# Patient Record
Sex: Male | Born: 1975 | Hispanic: No | Marital: Married | Smoking: Current some day smoker
Health system: Southern US, Community
[De-identification: ages and names within clinical notes are randomized; demographics above are authoritative.]

## PROBLEM LIST (undated history)

## (undated) DIAGNOSIS — K2970 Gastritis, unspecified, without bleeding: Secondary | ICD-10-CM

## (undated) DIAGNOSIS — K219 Gastro-esophageal reflux disease without esophagitis: Secondary | ICD-10-CM

## (undated) DIAGNOSIS — K2990 Gastroduodenitis, unspecified, without bleeding: Secondary | ICD-10-CM

## (undated) DIAGNOSIS — E7800 Pure hypercholesterolemia, unspecified: Secondary | ICD-10-CM

## (undated) HISTORY — DX: Gastro-esophageal reflux disease without esophagitis: K21.9

## (undated) HISTORY — DX: Gastroduodenitis, unspecified, without bleeding: K29.90

## (undated) HISTORY — DX: Gastritis, unspecified, without bleeding: K29.70

---

## 1999-12-01 ENCOUNTER — Emergency Department (HOSPITAL_COMMUNITY): Admission: EM | Admit: 1999-12-01 | Discharge: 1999-12-01 | Payer: Self-pay | Admitting: *Deleted

## 2004-02-16 ENCOUNTER — Emergency Department (HOSPITAL_COMMUNITY): Admission: EM | Admit: 2004-02-16 | Discharge: 2004-02-17 | Payer: Self-pay | Admitting: Emergency Medicine

## 2004-03-02 ENCOUNTER — Ambulatory Visit (HOSPITAL_COMMUNITY): Admission: RE | Admit: 2004-03-02 | Discharge: 2004-03-02 | Payer: Self-pay | Admitting: Gastroenterology

## 2004-05-26 ENCOUNTER — Ambulatory Visit (HOSPITAL_COMMUNITY): Admission: RE | Admit: 2004-05-26 | Discharge: 2004-05-26 | Payer: Self-pay | Admitting: Gastroenterology

## 2004-12-15 ENCOUNTER — Encounter: Admission: RE | Admit: 2004-12-15 | Discharge: 2004-12-15 | Payer: Self-pay | Admitting: Orthopedic Surgery

## 2006-01-09 ENCOUNTER — Ambulatory Visit: Payer: Self-pay | Admitting: Gastroenterology

## 2006-01-11 ENCOUNTER — Ambulatory Visit: Payer: Self-pay | Admitting: Gastroenterology

## 2006-02-28 ENCOUNTER — Emergency Department (HOSPITAL_COMMUNITY): Admission: EM | Admit: 2006-02-28 | Discharge: 2006-02-28 | Payer: Self-pay | Admitting: Emergency Medicine

## 2006-03-27 ENCOUNTER — Encounter: Admission: RE | Admit: 2006-03-27 | Discharge: 2006-05-12 | Payer: Self-pay | Admitting: Family Medicine

## 2007-10-16 ENCOUNTER — Ambulatory Visit: Payer: Self-pay | Admitting: Gastroenterology

## 2008-02-13 DIAGNOSIS — K219 Gastro-esophageal reflux disease without esophagitis: Secondary | ICD-10-CM

## 2008-02-13 DIAGNOSIS — K2990 Gastroduodenitis, unspecified, without bleeding: Secondary | ICD-10-CM

## 2008-02-13 DIAGNOSIS — K2970 Gastritis, unspecified, without bleeding: Secondary | ICD-10-CM | POA: Insufficient documentation

## 2010-02-09 ENCOUNTER — Telehealth: Payer: Self-pay | Admitting: Gastroenterology

## 2010-03-16 ENCOUNTER — Encounter (INDEPENDENT_AMBULATORY_CARE_PROVIDER_SITE_OTHER): Payer: Self-pay | Admitting: *Deleted

## 2010-03-16 ENCOUNTER — Ambulatory Visit: Payer: Self-pay | Admitting: Gastroenterology

## 2010-03-16 DIAGNOSIS — K625 Hemorrhage of anus and rectum: Secondary | ICD-10-CM | POA: Insufficient documentation

## 2010-03-17 LAB — CONVERTED CEMR LAB
BUN: 10 mg/dL (ref 6–23)
Basophils Absolute: 0 10*3/uL (ref 0.0–0.1)
Basophils Relative: 1 % (ref 0.0–3.0)
CO2: 33 meq/L — ABNORMAL HIGH (ref 19–32)
Calcium: 9.6 mg/dL (ref 8.4–10.5)
Chloride: 102 meq/L (ref 96–112)
Creatinine, Ser: 0.8 mg/dL (ref 0.4–1.5)
Eosinophils Absolute: 0.2 10*3/uL (ref 0.0–0.7)
Eosinophils Relative: 7.1 % — ABNORMAL HIGH (ref 0.0–5.0)
GFR calc non Af Amer: 117.61 mL/min (ref >60)
Glucose, Bld: 95 mg/dL (ref 70–99)
HCT: 43.6 % (ref 39.0–52.0)
Hemoglobin: 15 g/dL (ref 13.0–17.0)
Lymphocytes Relative: 41.2 % (ref 12.0–46.0)
Lymphs Abs: 1.5 10*3/uL (ref 0.7–4.0)
MCHC: 34.5 g/dL (ref 30.0–36.0)
MCV: 98.5 fL (ref 78.0–100.0)
Monocytes Absolute: 0.4 10*3/uL (ref 0.1–1.0)
Monocytes Relative: 11.4 % (ref 3.0–12.0)
Neutro Abs: 1.4 10*3/uL (ref 1.4–7.7)
Neutrophils Relative %: 39.3 % — ABNORMAL LOW (ref 43.0–77.0)
Platelets: 182 10*3/uL (ref 150.0–400.0)
Potassium: 4.7 meq/L (ref 3.5–5.1)
RBC: 4.43 M/uL (ref 4.22–5.81)
RDW: 12.3 % (ref 11.5–14.6)
Sodium: 142 meq/L (ref 135–145)
TSH: 2.04 microintl units/mL (ref 0.35–5.50)
WBC: 3.5 10*3/uL — ABNORMAL LOW (ref 4.5–10.5)

## 2010-03-23 ENCOUNTER — Ambulatory Visit: Payer: Self-pay | Admitting: Gastroenterology

## 2010-06-09 ENCOUNTER — Telehealth: Payer: Self-pay | Admitting: Gastroenterology

## 2011-01-09 ENCOUNTER — Encounter: Payer: Self-pay | Admitting: Gastroenterology

## 2011-01-20 NOTE — Procedures (Signed)
Summary: Colonoscopy  Patient: Corday Wyka Note: All result statuses are Final unless otherwise noted.  Tests: (1) Colonoscopy (COL)   COL Colonoscopy           DONE     [facility]     [address]     [place], [place]  [address]           COLONOSCOPY PROCEDURE REPORT           PATIENT:  Dustin Meyer, Dustin Meyer  MR#:  [id number]     BIRTHDATE:  1976/01/04, 34 yrs. old  GENDER:  male     ENDOSCOPIST:  Rachael Fee, MD     REF. BY:     PROCEDURE DATE:  03/23/2010     PROCEDURE:  Diagnostic Colonoscopy     ASA CLASS:  Class II     INDICATIONS:  intermittent rectal bleeding     MEDICATIONS:   Fentanyl 100 mcg IV, Versed 10 mg IV           DESCRIPTION OF PROCEDURE:   After the risks benefits and     alternatives of the procedure were thoroughly explained, informed     consent was obtained.  Digital rectal exam was performed and     revealed no rectal masses.   The LB CF-[id number] endoscope     was introduced through the anus and advanced to the cecum, which     was identified by both the appendix and ileocecal valve, without     limitations.  The quality of the prep was good, using MoviPrep.     The instrument was then slowly withdrawn as the colon was fully     examined.     <<PROCEDUREIMAGES>>           FINDINGS:  A normal appearing cecum, ileocecal valve, and     appendiceal orifice were identified. The ascending, hepatic     flexure, transverse, splenic flexure, descending, sigmoid colon,     and rectum appeared unremarkable (see image1, image2, and image3).     Retroflexed views in the rectum revealed no abnormalities.    The     scope was then withdrawn from the patient and the procedure     completed.           COMPLICATIONS:  None     ENDOSCOPIC IMPRESSION:     1) Normal colon ; no inflammation, no polyps or cancers           Intermittent rectal bleeding is probably from small fissure or     hemorrhoids that can be intermittent.     Start miralax (to help  loosen stools a bit, keep you from     straining), one scoop every day.     Use preparation H as needed for minor rectal bleeding.           ______________________________     Rachael Fee, MD           n.     eSIGNED:   Rachael Fee at 03/23/2010 12:12 PM           Siri Cole, [phone]  Note: An exclamation mark (!) indicates a result that was not dispersed into the flowsheet. Document Creation Date: 03/23/2010 12:12 PM _______________________________________________________________________  (1) Order result status: Final Collection or observation date-time: 03/23/2010 12:07 Requested date-time:  Receipt date-time:  Reported date-time:  Referring Physician:   Ordering Physician: Rob Bunting [phone]) Specimen Source:  Source:  [employer] Order Number: [id number] Lab site:

## 2011-01-20 NOTE — Progress Notes (Signed)
Summary: Nexium refill ?'s   Phone Note Call from Patient Call back at Home Phone [phone]   Caller: wife, Dustin Meyer Call For: Dr. Christella Hartigan Reason for Call: Talk to Nurse Summary of Call: would like rx for Nexium changed to a 90-day count so that it is more affordable for pt... [facility] [address] in [place], but pt's ins company would like the revised rx sent to them and not [facility]...  Initial call taken by: Vallarie Mare,  June 09, 2010 4:23 PM  Follow-up for Phone Call        left message on machine to call back [name] CMA Duncan Dull)  June 10, 2010 8:05 AM   pt returned call and gave me a number to fax rx info [phone]. [name] CMA Duncan Dull)  June 10, 2010 8:20 AM  Follow-up by: [name] CMA Duncan Dull),  June 10, 2010 8:21 AM    Prescriptions: NEXIUM 40 MG  CPDR (ESOMEPRAZOLE MAGNESIUM) 1 capsule each day 30 minutes before breakfast meal  #90 x 3   Entered by:   [name] CMA (AAMA)   Authorized by:   Rachael Fee MD   Signed by:   [name] CMA (AAMA) on 06/10/2010   Method used:   Printed then faxed to ...       [facility] [address] [address] # [phone]* (retail)       [address] [place], [place]  [address]       Ph: [phone]       Fax: [phone]   RxID:   [id number]

## 2011-01-20 NOTE — Letter (Signed)
Summary: [facility] Instructions  [facility]  [address] [place], [place] [address]   Phone: [phone]  Fax: [phone]       Dustin Meyer    [birth year]    MRN: [id number]        Procedure Day /Date:03/23/10     Arrival Time:1030 am     Procedure Time:1130 am     Location of Procedure:                    X  [facility] (4th Floor)   PREPARATION FOR COLONOSCOPY WITH MOVIPREP   Starting 5 days prior to your procedure 03/18/10 do not eat nuts, seeds, popcorn, corn, beans, peas,  salads, or any raw vegetables.  Do not take any fiber supplements (e.g. Metamucil, Citrucel, and Benefiber).  THE DAY BEFORE YOUR PROCEDURE         DATE: 03/22/10  DAY: MON  1.  Drink clear liquids the entire day-NO SOLID FOOD  2.  Do not drink anything colored red or purple.  Avoid juices with pulp.  No orange juice.  3.  Drink at least 64 oz. (8 glasses) of fluid/clear liquids during the day to prevent dehydration and help the prep work efficiently.  CLEAR LIQUIDS INCLUDE: Water Jello Ice Popsicles Tea (sugar ok, no milk/cream) Powdered fruit flavored drinks Coffee (sugar ok, no milk/cream) Gatorade Juice: apple, white grape, white cranberry  Lemonade Clear bullion, consomm, broth Carbonated beverages (any kind) Strained chicken noodle soup Hard Candy                             4.  In the morning, mix first dose of MoviPrep solution:    Empty 1 Pouch A and 1 Pouch B into the disposable container    Add lukewarm drinking water to the top line of the container. Mix to dissolve    Refrigerate (mixed solution should be used within 24 hrs)  5.  Begin drinking the prep at 5:00 p.m. The MoviPrep container is divided by 4 marks.   Every 15 minutes drink the solution down to the next mark (approximately 8 oz) until the full liter is complete.   6.  Follow completed prep with 16 oz of clear liquid of your choice (Nothing red or purple).  Continue to drink clear  liquids until bedtime.  7.  Before going to bed, mix second dose of MoviPrep solution:    Empty 1 Pouch A and 1 Pouch B into the disposable container    Add lukewarm drinking water to the top line of the container. Mix to dissolve    Refrigerate  THE DAY OF YOUR PROCEDURE      DATE: 03/23/10 DAY: TUE  Beginning at 630 a.m. (5 hours before procedure):         1. Every 15 minutes, drink the solution down to the next mark (approx 8 oz) until the full liter is complete.  2. Follow completed prep with 16 oz. of clear liquid of your choice.    3. You may drink clear liquids until 930 am (2 HOURS BEFORE PROCEDURE).   MEDICATION INSTRUCTIONS  Unless otherwise instructed, you should take regular prescription medications with a small sip of water   as early as possible the morning of your procedure.         OTHER INSTRUCTIONS  You will need a responsible adult at least 35 years of age to  accompany you and drive you home.   This person must remain in the waiting room during your procedure.  Wear loose fitting clothing that is easily removed.  Leave jewelry and other valuables at home.  However, you may wish to bring a book to read or  an iPod/MP3 player to listen to music as you wait for your procedure to start.  Remove all body piercing jewelry and leave at home.  Total time from sign-in until discharge is approximately 2-3 hours.  You should go home directly after your procedure and rest.  You can resume normal activities the  day after your procedure.  The day of your procedure you should not:   Drive   Make legal decisions   Operate machinery   Drink alcohol   Return to work  You will receive specific instructions about eating, activities and medications before you leave.    The above instructions have been reviewed and explained to me by   _______________________    I fully understand and can verbalize these instructions _____________________________ Date  _________

## 2011-01-20 NOTE — Assessment & Plan Note (Signed)
Review of gastrointestinal problems: 1. Chronic gastroesophageal reflux disease.  Pyrosis.  Acid regurgitation, improved with proton pump inhibitor once daily. EGD in 2007 showed no esophagitis, no Barrett's no strictures.   History of Present Illness Visit Type: Follow-up Visit Primary GI MD: Rob Bunting MD Primary Provider: n/a Chief Complaint: BRB with stools/med refills History of Present Illness:     very pleasant 35 year old man whom I last saw about 2 years ago.  he takes Nexium every morning and it works very well.  Overall no alarm symptoms.    He has noted red rectal bleeding every 2-3 months. this started about one year ago. Some perianal bleeding.  He has to push, strain to have a BM usually, every day.  Uses a lot of TP.  he has tried fiber supplements without much change in his bowel habits.           Current Medications (verified): 1)  Nexium 40 Mg  Cpdr (Esomeprazole Magnesium) .Marland Kitchen.. 1 Capsule Each Day 30 Minutes Before Meal 2)  Ibuprofen 200 Mg Tabs (Ibuprofen) .... As Needed  Allergies (verified): No Known Drug Allergies  Family History: no colon cancer  Vital Signs:  Patient profile:   35 year old male Height:      60 inches Weight:      138.13 pounds BMI:     27.07 Pulse rate:   84 / minute Pulse rhythm:   regular BP sitting:   108 / 80  (left arm) Cuff size:   regular  Vitals Entered By: June McMurray CMA Duncan Dull) (March 16, 2010 10:03 AM)  Physical Exam  Additional Exam:  Constitutional: generally well appearing Psychiatric: alert and oriented times 3 Abdomen: soft, non-tender, non-distended, normal bowel sounds Anorectal examination: no obvious external anal hemorrhoids, no anal fissures, no masses in rectum, no stool on finger   Impression & Recommendations:  Problem # 1:  GERD (ICD-530.81) his symptoms are under very good control as long as he takes proton pump inhibitor once daily. He has no alarm symptoms. I will refill his Nexium he  will continue taking it once daily.  Problem # 2:  mild intermittent bright red blood per rectum he does not appear to be anemic clinically however we will get a CBC to be certain. His stools are also softer than usual and we will arrange for a complete metabolic profile as well as thyroid testing. This sounds like minor, hemorrhoidal or anal fissure related bleeding. None were seen on today's examination however. To be safe, we will arrange for full colonoscopy to exclude more significant causes such as neoplasm.  Other Orders: TLB-CBC Platelet - w/Differential (85025-CBCD) TLB-BMP (Basic Metabolic Panel-BMET) (80048-METABOL) TLB-TSH (Thyroid Stimulating Hormone) (84443-TSH)  Patient Instructions: 1)  You will be scheduled to have a colonoscopy. 2)  Will refill your nexium, one pill once dialy. 3)  You will get lab test(s) done today (cbc, tsh, bmet). 4)  The medication list was reviewed and reconciled.  All changed / newly prescribed medications were explained.  A complete medication list was provided to the patient / caregiver. Prescriptions: NEXIUM 40 MG  CPDR (ESOMEPRAZOLE MAGNESIUM) 1 capsule each day 30 minutes before breakfast meal  #30 x 11   Entered and Authorized by:   Rachael Fee MD   Signed by:   Rachael Fee MD on 03/16/2010   Method used:   Electronically to        [facility] [address] # [phone]* (retail)  [address] [place], [place]  [address]       Ph: [phone]       Fax: [phone]   RxID:   [id number]   Appended Document: Orders Update/movi    Clinical Lists Changes  Problems: Added new problem of RECTAL BLEEDING (ICD-569.3) Medications: Added new medication of MOVIPREP 100 GM  SOLR (PEG-KCL-NACL-NASULF-NA ASC-C) As per prep instructions. - Signed Rx of MOVIPREP 100 GM  SOLR (PEG-KCL-NACL-NASULF-NA ASC-C) As per prep instructions.;  #1 x 0;  Signed;  Entered by: [name] CMA (AAMA);  Authorized by: Rachael Fee MD;  Method used: Electronically to [facility] # [phone]*, [address] [place], [place], [place]  [address], Ph: [phone], Fax: [phone] Orders: Added new Test order of Colonoscopy (Colon) - Signed    Prescriptions: MOVIPREP 100 GM  SOLR (PEG-KCL-NACL-NASULF-NA ASC-C) As per prep instructions.  #1 x 0   Entered by:   [name] CMA (AAMA)   Authorized by:   Rachael Fee MD   Signed by:   [name] CMA (AAMA) on 03/16/2010   Method used:   Electronically to        [facility] # [phone]* (retail)       [address] [place], [place]  [address]       Ph: [phone]       Fax: [phone]   RxID:   [id number]

## 2011-01-20 NOTE — Progress Notes (Signed)
Summary: Medication refill   Phone Note Call from Patient   Caller: Patient Call For: Dr. Christella Hartigan Reason for Call: Refill Medication Summary of Call: Pt needs a refill of his Nexium. He is scheduled for an appt. on 03-16-10 but is completely out Initial call taken by: Karna Christmas,  February 09, 2010 12:19 PM  Follow-up for Phone Call        tried to return pt call but the number we have is incorrect.  Rx was sent to the pharmacy Follow-up by: [name] CMA Duncan Dull),  February 09, 2010 12:44 PM     Appended Document: Medication refill [phone] new number to reach pt.

## 2011-04-02 ENCOUNTER — Other Ambulatory Visit: Payer: Self-pay | Admitting: Gastroenterology

## 2011-04-13 ENCOUNTER — Other Ambulatory Visit: Payer: Self-pay | Admitting: Gastroenterology

## 2011-05-03 NOTE — Assessment & Plan Note (Signed)
[facility]                         GASTROENTEROLOGY OFFICE NOTE   NAME:Dustin Meyer, Dustin Meyer                       MRN:          [id number]  DATE:10/16/2007                            DOB:          1976-03-30    He has no primary care physician.   GI PROBLEM LIST:  1. Chronic gastroesophageal reflux disease.  Pyrosis.  Acid      regurgitation, improved with proton pump inhibitor once daily. EGD      1 in 2007 showed no esophagitis, no Barrett's no strictures.   INTERVAL HISTORY:  I last saw Dustin Meyer almost 2 years ago.  He continues to  have mild heartburn and pyrosis.  These are definitely improved when he  takes Nexium once daily.  He does not drink too much caffeine but drinks  a bit of alcohol every day.  He has no dysphagia, no vomiting, no  bleeding.   CURRENT MEDICINES:  Nexium 40 mg once daily.   PHYSICAL EXAM:  135 pounds, blood pressure 118/80, pulse 84.  CONSTITUTIONAL:  Generally well appearing.  ABDOMEN:  Soft, nontender, nondistended.  Normal bowel sounds.  EXTREMITIES:  No lower extremity edema.   ASSESSMENT AND PLAN:  A 35 year old man with chronic gastroesophageal  reflux disease, no alarm symptoms, esophagogastroduodenoscopy in 2007.   I have given him samples for Nexium, I am also calling him in a  prescription for the medicine.  I have given him a GERD handout so he  can look that over to hopefully modify some of his lifestyle habits that  may be contributing.  He knows to call if he has any further troubles.     Rachael Fee, MD  Electronically Signed    DPJ/MedQ  DD: 10/16/2007  DT: 10/16/2007  Job #: [id number]

## 2011-05-06 NOTE — Op Note (Signed)
NAME:  Dustin Meyer, Dustin Meyer                          ACCOUNT NO.:  [id number]   MEDICAL RECORD NO.:  [id number]                   PATIENT TYPE:  AMB   LOCATION:  ENDO                                 FACILITY:  [facility]   PHYSICIAN:  Graylin Shiver, M.D.                DATE OF BIRTH:  1976-04-04   DATE OF PROCEDURE:  03/02/2004  DATE OF DISCHARGE:                                 OPERATIVE REPORT   PROCEDURE:  Esophagogastroduodenoscopy.   INDICATIONS:  Abdominal pain.   Informed consent was obtained after explanation of the risks of bleeding,  infection, and perforation.   PREMEDICATION:  Fentanyl 60 mcg IV, Versed 6 mg IV.   DESCRIPTION OF PROCEDURE:  With the patient in the left lateral decubitus  position, the Olympus gastroscope was inserted into the oropharynx and  passed into the esophagus.  It was advanced down the esophagus, then into  the stomach and into the duodenum.  The second portion and bulb of the  duodenum looked normal.  The stomach showed a diffuse, moderate erythematous  appearance to the mucosa consistent with gastritis.  No ulcers were seen.  Biopsies were obtained for CLOtest.  No lesions were seen in the fundus or  cardia.  The esophagus looked normal in its entirety.  He tolerated the  procedure well without complications.   IMPRESSION:  Diffuse gastritis.   PLAN:  The CLOtest will be checked.  If it is positive, he will be treated.  I am going to recommend that the patient try Prilosec 20 mg daily for the  upper abdominal discomfort to see if this helps.                                               Graylin Shiver, M.D.    Germain Osgood  D:  03/02/2004  T:  03/03/2004  Job:  [id number]

## 2011-05-06 NOTE — Op Note (Signed)
NAME:  Dustin Meyer, Dustin Meyer                          ACCOUNT NO.:  [id number]   MEDICAL RECORD NO.:  [id number]                   PATIENT TYPE:  AMB   LOCATION:  ENDO                                 FACILITY:  [facility]   PHYSICIAN:  Graylin Shiver, M.D.                DATE OF BIRTH:  [birth year]   DATE OF PROCEDURE:  03/02/2004  DATE OF DISCHARGE:                                 OPERATIVE REPORT   PROCEDURE PERFORMED:  Colonoscopy.   INDICATIONS FOR PROCEDURE:  Abdominal pain, rectal bleeding.   Informed consent was obtained after explanation of the risks of bleeding,  infection, and perforation.   PREMEDICATIONS:  The procedure was done after an EGD with an additional 40  mcg of fentanyl and 4 mg of Versed.   DESCRIPTION OF PROCEDURE:  With the patient in the left lateral decubitus  position, a rectal exam was performed and no masses were felt.  The Olympus  colonoscope was inserted into the rectum and advanced around the colon to  the cecum.  Cecal landmarks were identified.  The cecum and ascending colon  were normal.  The transverse colon normal.  The descending colon, sigmoid  and rectum were normal.  There was some solid stool present in the colon.  No obvious large gross abnormalities were noted.  The scope was retroflexed  in the rectum.  There was some hyperemia distally in the rectum but no other  abnormalities.  The scope was straightened and brought out.  The patient  tolerated the procedure well without complications.   IMPRESSION:  Normal colonoscopy to the cecum.                                               Graylin Shiver, M.D.    Germain Osgood  D:  03/02/2004  T:  03/03/2004  Job:  [id number]

## 2011-06-04 ENCOUNTER — Other Ambulatory Visit: Payer: Self-pay | Admitting: Gastroenterology

## 2011-06-07 ENCOUNTER — Other Ambulatory Visit: Payer: Self-pay | Admitting: Gastroenterology

## 2011-06-07 MED ORDER — ESOMEPRAZOLE MAGNESIUM 40 MG PO CPDR: 40.0000 mg | DELAYED_RELEASE_CAPSULE | Freq: Every day | ORAL | Status: DC

## 2011-06-07 NOTE — Telephone Encounter (Signed)
rx sent to the pharmacy unable to tell pt his phone is not accepting calls

## 2011-07-20 ENCOUNTER — Ambulatory Visit: Payer: Self-pay | Admitting: Gastroenterology

## 2011-08-26 ENCOUNTER — Encounter: Payer: Self-pay | Admitting: Gastroenterology

## 2011-08-26 ENCOUNTER — Ambulatory Visit (INDEPENDENT_AMBULATORY_CARE_PROVIDER_SITE_OTHER): Payer: Commercial Managed Care - PPO | Admitting: Gastroenterology

## 2011-08-26 VITALS — BP 106/74 | HR 72 | Ht 60.0 in | Wt 139.0 lb

## 2011-08-26 DIAGNOSIS — K219 Gastro-esophageal reflux disease without esophagitis: Secondary | ICD-10-CM

## 2011-08-26 MED ORDER — PANTOPRAZOLE SODIUM 40 MG PO TBEC: 40.0000 mg | DELAYED_RELEASE_TABLET | Freq: Every day | ORAL | Status: DC

## 2011-08-26 NOTE — Progress Notes (Signed)
Review of gastrointestinal problems:  1. Chronic gastroesophageal reflux disease. Pyrosis. Acid regurgitation, improved with proton pump inhibitor once daily. EGD in 2007 showed no esophagitis, no Barrett's no strictures.  2. Mild intermittent rectal bleeding, likely small hem or fissure. Colonoscopy 03/2010 Christella Hartigan was normal.  HPI: This is a  pleasant 35 year old man who is here with his wife and daughter today.  No recurrent rectal bleeding.  Takes nexium, one pill daily.  Usually in AM, eats BF later.  No GERD symptosm if he takes it daily.  If he misses, then pyrosis returns.  No dysphagia, no overt GI bleeding, he has gained weight.      Review of systems: Pertinent positive and negative review of systems were noted in the above HPI section.  All other review of systems was otherwise negative.   Past Medical History  Diagnosis Date  . Unspecified gastritis and gastroduodenitis without mention of hemorrhage   . Esophageal reflux     No past surgical history on file.   reports that he has been smoking.  He has never used smokeless tobacco. He reports that he drinks alcohol. He reports that he does not use illicit drugs.  family history is negative for Colon cancer.    Current Medications, Allergies were all reviewed with the patient via [facility] electronic medical record system.    Physical Exam: BP 106/74  Pulse 72  Ht 5' (1.524 m)  Wt 139 lb (63.05 kg)  BMI 27.15 kg/m2 Constitutional: generally well-appearing Psychiatric: alert and oriented x3 Eyes: extraocular movements intact Mouth: oral pharynx moist, no lesions Neck: supple no lymphadenopathy Cardiovascular: heart regular rate and rhythm Lungs: clear to auscultation bilaterally Abdomen: soft, nontender, nondistended, no obvious ascites, no peritoneal signs, normal bowel sounds Extremities: no lower extremity edema bilaterally Skin: no lesions on visible extremities    Assessment and plan: 35  y.o. male with chronic GERD without alarm symptoms  He tells me that he was paying $300 per month for his Nexium. This is an incredible amount. I have changed his prescription to pantoprazole, it seems to be more favored by his insurance company. He has no alarm symptoms and as long as this continues he does not need to see me again for about 2 years.

## 2011-08-26 NOTE — Patient Instructions (Addendum)
One of your biggest health concerns is your smoking.  This increases your risk for most cancers and serious cardiovascular diseases such as strokes, heart attacks.  You should try your best to stop.  If you need assistance, please contact your PCP or Smoking Cessation Class at [facility] [phone]) or [facility] Quit-Line (1-800-QUIT-NOW). New antiacid med called into your pharmacy (pantoprazole), take one pill once daily. Return to see Dr. Christella Hartigan in 2 years, will refill PPI for 2 years.

## 2012-11-22 ENCOUNTER — Emergency Department (HOSPITAL_BASED_OUTPATIENT_CLINIC_OR_DEPARTMENT_OTHER)
Admission: EM | Admit: 2012-11-22 | Discharge: 2012-11-23 | Disposition: A | Discharge status: Home / Self Care | Payer: BC Managed Care – PPO | Attending: Emergency Medicine | Admitting: Emergency Medicine

## 2012-11-22 ENCOUNTER — Emergency Department (HOSPITAL_BASED_OUTPATIENT_CLINIC_OR_DEPARTMENT_OTHER): Payer: BC Managed Care – PPO

## 2012-11-22 ENCOUNTER — Encounter (HOSPITAL_BASED_OUTPATIENT_CLINIC_OR_DEPARTMENT_OTHER): Payer: Self-pay | Admitting: *Deleted

## 2012-11-22 DIAGNOSIS — Z8719 Personal history of other diseases of the digestive system: Secondary | ICD-10-CM | POA: Insufficient documentation

## 2012-11-22 DIAGNOSIS — Z79899 Other long term (current) drug therapy: Secondary | ICD-10-CM | POA: Insufficient documentation

## 2012-11-22 DIAGNOSIS — K219 Gastro-esophageal reflux disease without esophagitis: Secondary | ICD-10-CM | POA: Insufficient documentation

## 2012-11-22 DIAGNOSIS — E7800 Pure hypercholesterolemia, unspecified: Secondary | ICD-10-CM | POA: Insufficient documentation

## 2012-11-22 DIAGNOSIS — F17200 Nicotine dependence, unspecified, uncomplicated: Secondary | ICD-10-CM | POA: Insufficient documentation

## 2012-11-22 DIAGNOSIS — N12 Tubulo-interstitial nephritis, not specified as acute or chronic: Secondary | ICD-10-CM | POA: Insufficient documentation

## 2012-11-22 DIAGNOSIS — R112 Nausea with vomiting, unspecified: Secondary | ICD-10-CM | POA: Insufficient documentation

## 2012-11-22 HISTORY — DX: Gastro-esophageal reflux disease without esophagitis: K21.9

## 2012-11-22 HISTORY — DX: Pure hypercholesterolemia, unspecified: E78.00

## 2012-11-22 LAB — URINALYSIS, ROUTINE W REFLEX MICROSCOPIC
Bilirubin Urine: NEGATIVE
Glucose, UA: NEGATIVE mg/dL
Hgb urine dipstick: NEGATIVE
Ketones, ur: NEGATIVE mg/dL
Leukocytes, UA: NEGATIVE
Nitrite: NEGATIVE
Specific Gravity, Urine: 1.008 (ref 1.005–1.030)
Urobilinogen, UA: 0.2 mg/dL (ref 0.0–1.0)
pH: 6.5 (ref 5.0–8.0)

## 2012-11-22 LAB — BASIC METABOLIC PANEL
GFR calc Af Amer: 63 mL/min — ABNORMAL LOW (ref >90)
GFR calc non Af Amer: 54 mL/min — ABNORMAL LOW (ref >90)
Potassium: 3 mEq/L — ABNORMAL LOW (ref 3.5–5.1)
Sodium: 140 mEq/L (ref 135–145)

## 2012-11-22 LAB — CBC WITH DIFFERENTIAL/PLATELET
Basophils Absolute: 0 10*3/uL (ref 0.0–0.1)
Basophils Relative: 0 % (ref 0–1)
Eosinophils Absolute: 0.2 10*3/uL (ref 0.0–0.7)
Hemoglobin: 14.3 g/dL (ref 13.0–17.0)
Lymphocytes Relative: 9 % — ABNORMAL LOW (ref 12–46)
MCHC: 37.3 g/dL — ABNORMAL HIGH (ref 30.0–36.0)
Neutrophils Relative %: 81 % — ABNORMAL HIGH (ref 43–77)
RDW: 11.5 % (ref 11.5–15.5)

## 2012-11-22 LAB — URINE MICROSCOPIC-ADD ON

## 2012-11-22 MED ORDER — ONDANSETRON HCL 4 MG/2ML IJ SOLN
4.0000 mg | Freq: Once | INTRAMUSCULAR | Status: AC
  Administered 2012-11-22: 4 mg via INTRAVENOUS
  Filled 2012-11-22: qty 2

## 2012-11-22 MED ORDER — IOHEXOL 300 MG/ML  SOLN
100.0000 mL | Freq: Once | INTRAMUSCULAR | Status: AC | PRN
  Administered 2012-11-22: 80 mL via INTRAVENOUS

## 2012-11-22 MED ORDER — MORPHINE SULFATE 4 MG/ML IJ SOLN
2.0000 mg | Freq: Once | INTRAMUSCULAR | Status: AC
  Administered 2012-11-22: 2 mg via INTRAVENOUS
  Filled 2012-11-22: qty 1

## 2012-11-22 MED ORDER — IOHEXOL 300 MG/ML  SOLN: 25.0000 mL | INTRAMUSCULAR | Status: AC

## 2012-11-22 MED ORDER — HYDROMORPHONE HCL PF 1 MG/ML IJ SOLN
1.0000 mg | Freq: Once | INTRAMUSCULAR | Status: AC
  Administered 2012-11-22: 1 mg via INTRAVENOUS
  Filled 2012-11-22: qty 1

## 2012-11-22 MED ORDER — SODIUM CHLORIDE 0.9 % IV BOLUS (SEPSIS)
1000.0000 mL | Freq: Once | INTRAVENOUS | Status: AC
  Administered 2012-11-22: 1000 mL via INTRAVENOUS

## 2012-11-22 MED ORDER — OXYCODONE-ACETAMINOPHEN 5-325 MG PO TABS: 1.0000 | ORAL_TABLET | ORAL | Status: DC | PRN

## 2012-11-22 MED ORDER — DEXTROSE 5 % IV SOLN
2.0000 g | Freq: Once | INTRAVENOUS | Status: AC
  Administered 2012-11-22: 2 g via INTRAVENOUS
  Filled 2012-11-22: qty 2

## 2012-11-22 MED ORDER — MORPHINE SULFATE 4 MG/ML IJ SOLN
2.0000 mg | Freq: Once | INTRAMUSCULAR | Status: AC
  Administered 2012-11-22: 4 mg via INTRAVENOUS
  Filled 2012-11-22: qty 1

## 2012-11-22 MED ORDER — CEPHALEXIN 500 MG PO CAPS: 500.0000 mg | ORAL_CAPSULE | Freq: Four times a day (QID) | ORAL | Status: DC

## 2012-11-22 MED ORDER — ACETAMINOPHEN 325 MG PO TABS
650.0000 mg | ORAL_TABLET | Freq: Once | ORAL | Status: AC
  Administered 2012-11-22: 650 mg via ORAL
  Filled 2012-11-22: qty 2

## 2012-11-22 NOTE — ED Provider Notes (Signed)
Medical screening examination/treatment/procedure(s) were conducted as a shared visit with non-physician practitioner(s) and myself.  I personally evaluated the patient during the encounter   Joya Gaskins, MD 11/22/12 2328

## 2012-11-22 NOTE — ED Notes (Signed)
Urine culture to be done on patients urinalysis, followed up with lab to ensure testing completed

## 2012-11-22 NOTE — ED Notes (Addendum)
Abdominal pain x 6 hours. Pain in his right mid abdomen. Pt is being treated for possible UTI. Has been taking Ibuprofen.

## 2012-11-22 NOTE — ED Provider Notes (Signed)
History     CSN: [id number]  Arrival date & time 11/22/12  1901   First MD Initiated Contact with Patient 11/22/12 1919      Chief Complaint  Patient presents with  . Abdominal Pain    (Consider location/radiation/quality/duration/timing/severity/associated sxs/prior treatment) HPI Comments: Patient is a 36 year old male with a history of abdominal pain that started this afternoon. The pain is located in his RLQ of abdomen and does not radiate. Patient reports the pain started periumbilically and moved to RLQ. The pain is described as sharp and sever. The pain started gradually and progressively worsened since the onset. No alleviating/aggravating factors. The patient has tried nothing for symptoms without relief. Associated symptoms include nausea and vomiting. Patient denies fever, headache, diarrhea, chest pain, SOB, dysuria, constipation.  Patient is a 36 y.o. male presenting with abdominal pain.  Abdominal Pain The primary symptoms of the illness include abdominal pain, nausea and vomiting.    Past Medical History  Diagnosis Date  . Unspecified gastritis and gastroduodenitis without mention of hemorrhage   . Esophageal reflux   . GERD (gastroesophageal reflux disease)   . High cholesterol     History reviewed. No pertinent past surgical history.  Family History  Problem Relation Age of Onset  . Colon cancer Neg Hx     History  Substance Use Topics  . Smoking status: Current Some Day Smoker  . Smokeless tobacco: Never Used  . Alcohol Use: Yes      Review of Systems  Gastrointestinal: Positive for nausea, vomiting and abdominal pain.  All other systems reviewed and are negative.    Allergies  Review of patient's allergies indicates no known allergies.  Home Medications   Current Outpatient Rx  Name  Route  Sig  Dispense  Refill  . CIPROFLOXACIN HCL 500 MG PO TABS   Oral   Take 500 mg by mouth 2 (two) times daily.         . IBUPROFEN 800 MG PO TABS    Oral   Take 800 mg by mouth every 8 (eight) hours as needed.         Marland Kitchen SIMVASTATIN 40 MG PO TABS   Oral   Take 40 mg by mouth every evening.         Marland Kitchen PANTOPRAZOLE SODIUM 40 MG PO TBEC   Oral   Take 1 tablet (40 mg total) by mouth daily.   30 tablet   11     BP 112/78  Pulse 109  Temp 100 F (37.8 C) (Oral)  Resp 20  SpO2 100%  Physical Exam  Nursing note and vitals reviewed. Constitutional: He is oriented to person, place, and time. He appears well-developed and well-nourished. No distress.  HENT:  Head: Normocephalic and atraumatic.  Mouth/Throat: Oropharynx is clear and moist. No oropharyngeal exudate.  Eyes: Conjunctivae normal and EOM are normal. Pupils are equal, round, and reactive to light.  Neck: Normal range of motion. Neck supple.  Cardiovascular: Normal rate and regular rhythm.  Exam reveals no gallop and no friction rub.   No murmur heard. Pulmonary/Chest: Effort normal and breath sounds normal. He has no wheezes. He has no rales. He exhibits no tenderness.  Abdominal: Soft. He exhibits no distension. There is tenderness. There is no rebound and no guarding.       Tenderness at McBurny's point. No peritoneal signs.   Musculoskeletal: Normal range of motion.  Neurological: He is alert and oriented to person, place, and time. Coordination  normal.       Speech is goal-oriented. Moves limbs without ataxia.   Skin: Skin is warm and dry. He is not diaphoretic.  Psychiatric: He has a normal mood and affect. His behavior is normal.    ED Course  Procedures (including critical care time)  Labs Reviewed  URINALYSIS, ROUTINE W REFLEX MICROSCOPIC - Abnormal; Notable for the following:    APPearance CLOUDY (*)     Protein, ur 30 (*)     All other components within normal limits  CBC WITH DIFFERENTIAL - Abnormal; Notable for the following:    HCT 38.3 (*)     MCHC 37.3 (*)  RULED OUT INTERFERING SUBSTANCES   Neutrophils Relative 81 (*)     Lymphocytes  Relative 9 (*)     Neutro Abs 7.9 (*)     All other components within normal limits  BASIC METABOLIC PANEL - Abnormal; Notable for the following:    Potassium 3.0 (*)     Glucose, Bld 109 (*)     Creatinine, Ser 1.60 (*)     GFR calc non Af Amer 54 (*)     GFR calc Af Amer 63 (*)     All other components within normal limits  URINE MICROSCOPIC-ADD ON   Ct Abdomen Pelvis W Contrast  11/22/2012  *RADIOLOGY REPORT*  Clinical Data: Right lower quadrant abdominal pain.  CT ABDOMEN AND PELVIS WITH CONTRAST  Technique:  Multidetector CT imaging of the abdomen and pelvis was performed following the standard protocol during bolus administration of intravenous contrast.  Contrast: 80mL OMNIPAQUE IOHEXOL 300 MG/ML  SOLN  Comparison: CT of the abdomen and pelvis performed 02/17/2004  Findings: The visualized lung bases are clear.  The liver and spleen are unremarkable in appearance.  The gallbladder is within normal limits.  The pancreas and adrenal glands are unremarkable.  There is mild heterogeneity of bilateral renal enhancement, with associated perinephric stranding and trace fluid.  This may reflect mild bilateral pyelonephritis.  There is no evidence of hydronephrosis.  No renal or ureteral stones are seen.  No free fluid is identified.  The small bowel is unremarkable in appearance.  The stomach is within normal limits.  No acute vascular abnormalities are seen.  The appendix is normal in caliber and contains air, without evidence for appendicitis.  The colon is grossly unremarkable in appearance.  The bladder is moderately distended and grossly unremarkable.  The prostate remains normal in size.  No inguinal lymphadenopathy is seen.  No acute osseous abnormalities are identified.  There are chronic bilateral pars defects at L5, with grade 1 anterolisthesis of L5 on S1.  Associated mild focal lordosis is noted at this level.  IMPRESSION:  1.  Mildly heterogeneous bilateral renal enhancement, with associated  perinephric stranding and trace fluid, concerning for bilateral pyelonephritis.  Interstitial nephritis could conceivably have a similar appearance. 2.  Chronic bilateral pars defects at L5, with grade 1 anterolisthesis of L5 on S1.  Associated mild focal lordosis is seen at this level.   Original Report Authenticated By: Tonia Ghent, M.D.      No diagnosis found.    MDM  7:54 PM Labs pending. Patient will have fluids, morphine and zofran. Patient will have CT abdomen pelvis.   9:54 PM CT shows nephritis. Patient will be treated with Rocephin, IV fluids. Patient signed out to Dr. Bebe Shaggy.      Emilia Beck, [place] 11/22/12 2157

## 2012-11-22 NOTE — ED Provider Notes (Signed)
Pt seen with PA He is here for abd pain and back pain He was diagnosed with recent prostatitis (had urinary frequency/nocturia, started on cipro) and has urology followup later this month He is also uncircumcised.  With CT findings showing pyelo, needs abx and urology followup.  Will switch to keflex.  His appendix was seen and normal on CT.  He had suprapubic tenderness on my exam and he also had low back pain.  I doubt occult appendicitis or other acute abdominal process.  Discussed need for urology followup.  We discussed strict return precautions including worsened pain or repeat vomiting over the next 8-12 hours BP 125/85  Pulse 102  Temp 97.4 F (36.3 C) (Oral)  Resp 14  SpO2 98% I discussed this with patient and significant other  Joya Gaskins, MD 11/22/12 2259

## 2012-11-27 ENCOUNTER — Telehealth: Payer: Self-pay | Admitting: Gastroenterology

## 2012-11-27 NOTE — Telephone Encounter (Signed)
Pt has been having lower right side spasms he was diagnosed with prostatitis and Dr Pete Glatter is treating him.  He was given an appt for 12/14/12 and was advised to call if symptoms worsen or change. He is unsure if the spasms are related to the prostatitis and Dr Pete Glatter recommended GI eval.

## 2012-12-14 ENCOUNTER — Ambulatory Visit: Payer: BC Managed Care – PPO | Admitting: Gastroenterology

## 2013-01-25 ENCOUNTER — Ambulatory Visit: Payer: BC Managed Care – PPO | Admitting: Gastroenterology

## 2014-04-04 ENCOUNTER — Ambulatory Visit (INDEPENDENT_AMBULATORY_CARE_PROVIDER_SITE_OTHER): Payer: 59 | Admitting: Gastroenterology

## 2014-04-04 ENCOUNTER — Encounter: Payer: Self-pay | Admitting: Gastroenterology

## 2014-04-04 VITALS — BP 104/64 | HR 74 | Ht 60.0 in | Wt 135.4 lb

## 2014-04-04 DIAGNOSIS — R1013 Epigastric pain: Secondary | ICD-10-CM

## 2014-04-04 DIAGNOSIS — G8929 Other chronic pain: Secondary | ICD-10-CM

## 2014-04-04 MED ORDER — ESOMEPRAZOLE MAGNESIUM 40 MG PO PACK: 40.0000 mg | PACK | Freq: Two times a day (BID) | ORAL | Status: AC

## 2014-04-04 NOTE — Progress Notes (Signed)
Review of gastrointestinal problems:  1. Chronic gastroesophageal reflux disease. Pyrosis. Acid regurgitation, improved with proton pump inhibitor once daily. EGD in 2007 showed no esophagitis, no Barrett's no strictures.  2. Mild intermittent rectal bleeding, likely small hem or fissure. Colonoscopy 03/2010 Christella HartiganJacobs was normal.   HPI: This is a very pleasant 38 year old man who predominantly speaks Spanish but he does speak English fairly well and his wife was a primary English speaker help with his history quite a lot.     Has been having left lower abd pains radiates to RLQ.   Has been going on for years.  Will occur about every 6 months, lasts 2-3 days.  He also has a constant sore epigastrium.  Eating makes it worse.  HE takes ibuprofen 800mg  twice a day.  Currently switched to mobic and is not taking ibuprofen.  Was not taking NSAIDs in 2007 or 2008 when I did his upper endoscopy above.    Always looks to his wife for any answers.  IMPRESSION  CT scan 11/2012 for RLQ pains.:  1. Mildly heterogeneous bilateral renal enhancement, with  associated perinephric stranding and trace fluid, concerning for  bilateral pyelonephritis. Interstitial nephritis could conceivably  have a similar appearance.  2. Chronic bilateral pars defects at L5, with grade 1  anterolisthesis of L5 on S1. Associated mild focal lordosis is  seen at this level.   Review of systems: Pertinent positive and negative review of systems were noted in the above HPI section. Complete review of systems was performed and was otherwise normal.    Past Medical History  Diagnosis Date  . Unspecified gastritis and gastroduodenitis without mention of hemorrhage   . Esophageal reflux   . GERD (gastroesophageal reflux disease)   . High cholesterol     History reviewed. No pertinent past surgical history.  Current Outpatient Prescriptions  Medication Sig Dispense Refill  . Esomeprazole Magnesium (NEXIUM PO) Take by  mouth. Uses OTC Nexium 20mg  once a day      . ibuprofen (ADVIL,MOTRIN) 800 MG tablet Take 800 mg by mouth every 8 (eight) hours as needed.      . meloxicam (MOBIC) 15 MG tablet Take 15 mg by mouth daily.      . rosuvastatin (CRESTOR) 20 MG tablet Take 20 mg by mouth daily.      . traMADol (ULTRAM) 50 MG tablet Take by mouth every 6 (six) hours as needed.      . pantoprazole (PROTONIX) 40 MG tablet Take 1 tablet (40 mg total) by mouth daily.  30 tablet  11   No current facility-administered medications for this visit.    Allergies as of 04/04/2014  . (No Known Allergies)    Family History  Problem Relation Age of Onset  . Colon cancer Neg Hx     History   Social History  . Marital Status: Married    Spouse Name: N/A    Number of Children: 1  . Years of Education: N/A   Occupational History  . [employer]    Social History Main Topics  . Smoking status: Current Some Day Smoker  . Smokeless tobacco: Never Used  . Alcohol Use: Yes  . Drug Use: No  . Sexual Activity: Not on file   Other Topics Concern  . Not on file   Social History Narrative  . No narrative on file       Physical Exam: BP 104/64  Pulse 74  Ht 5' (1.524 m)  Wt 135 lb  6.4 oz (61.417 kg)  BMI 26.44 kg/m2 Constitutional: generally well-appearing Psychiatric: alert and oriented x3 Eyes: extraocular movements intact Mouth: oral pharynx moist, no lesions Neck: supple no lymphadenopathy Cardiovascular: heart regular rate and rhythm Lungs: clear to auscultation bilaterally Abdomen: soft, nontender, nondistended, no obvious ascites, no peritoneal signs, normal bowel sounds Extremities: no lower extremity edema bilaterally Skin: no lesions on visible extremities    Assessment and plan: 38 y.o. male with  chronic epigastric discomfort likely from NSAID overuse  He was taking 1.6 g of Advil per day for a year or more up until about a week ago. I think it is very likely that that caused significant  gastritis, perhaps peptic ulcer disease or the very least contributing to his dyspepsia discomforts. He change to Mobic last week. I explained to him with those pain medicines are almost certainly were causing his discomforts in his abdomen. I recommended he try to come off of Mobic if at all possible as well in the meantime he will double her proton pump inhibitor to Nexium twice daily. He'll be seeing his spine surgeon in the next week or 2 I recommended he consider other therapies for his chronic back pain which are requiring to take so much pain medicine in the first place.

## 2014-04-04 NOTE — Patient Instructions (Addendum)
You were taking advil at VERY high doses. You should not resume it. Mobic can also cause stomach trouble.   Increase your nexium to 40mg  twice daily. You should continue to see back doctor to consider other options for your back pain.

## 2014-04-14 ENCOUNTER — Telehealth: Payer: Self-pay

## 2014-04-14 NOTE — Telephone Encounter (Signed)
I have tried to get a prior auth for pt medication but the insurance card we have on file is not valid. Left message on machine to call back

## 2014-04-16 NOTE — Telephone Encounter (Signed)
Prior- auth was started

## 2014-09-17 ENCOUNTER — Encounter: Payer: Self-pay | Admitting: Gastroenterology

## 2014-10-20 IMAGING — CT CT ABD-PELV W/ CM
2 of 4 series · 16 of 46 positions shown, 18 images · IV contrast (APPLIED)
Comparison: CT of the abdomen and pelvis performed 02/17/2004

CLINICAL DATA: Right lower quadrant abdominal pain.

CT ABDOMEN AND PELVIS WITH CONTRAST
TECHNIQUE: Multidetector CT imaging of the abdomen and pelvis was
performed following the standard protocol during bolus
administration of intravenous contrast.
Contrast: 80mL OMNIPAQUE IOHEXOL 300 MG/ML  SOLN

[Series 2: abd/pelvis 5.0 b31f · axial · 0.64mm/px · z∈[+858,+1223]mm · 13 of 81 slices shown, 15 images]
[im 4/81  soft-tissue]
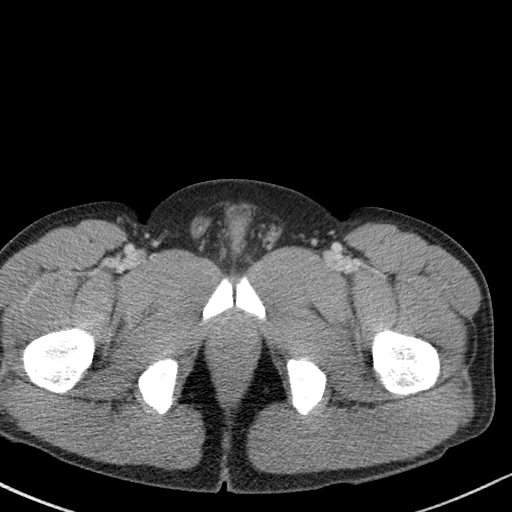
[im 4/81  bone]
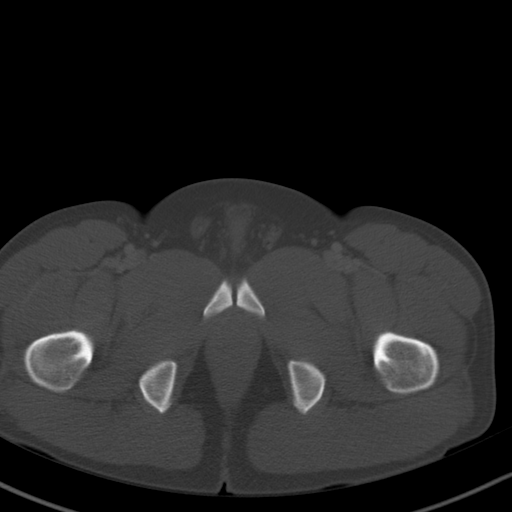
[im 11/81  soft-tissue]
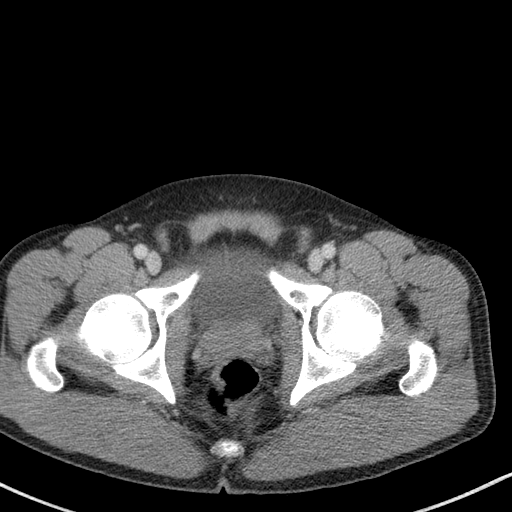
[im 18/81  soft-tissue]
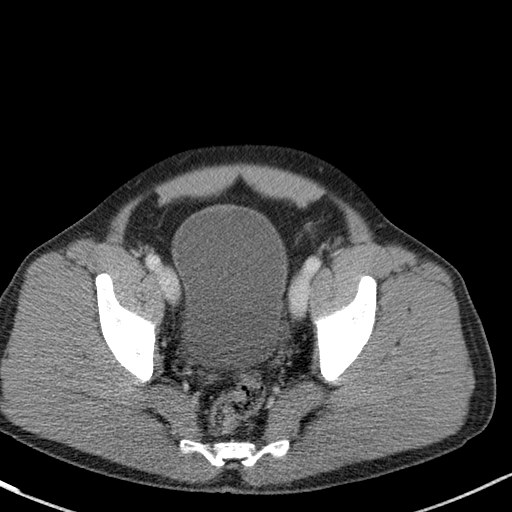
[im 21/81  soft-tissue]
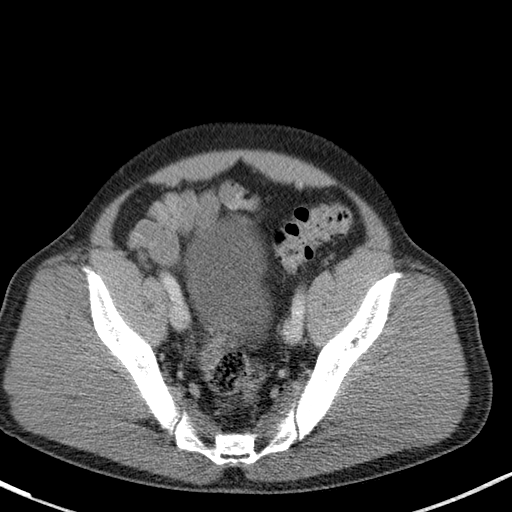
[im 28/81  soft-tissue]
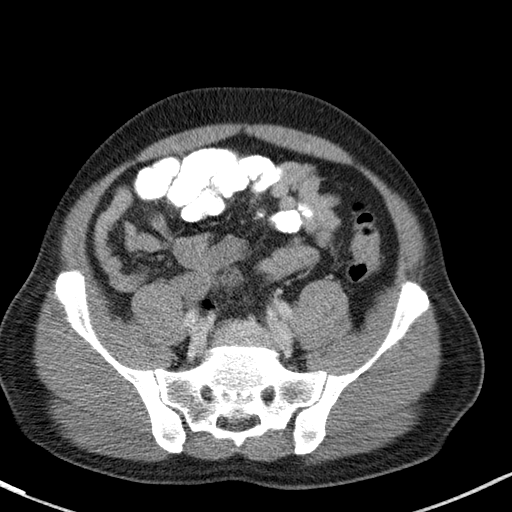
[im 35/81  soft-tissue]
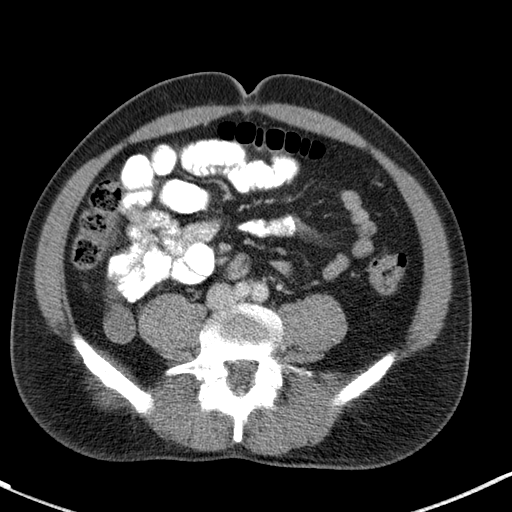
[im 42/81  soft-tissue]
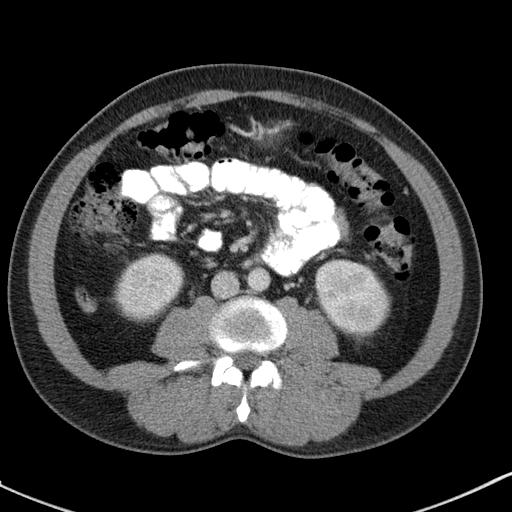
[im 46/81  soft-tissue]
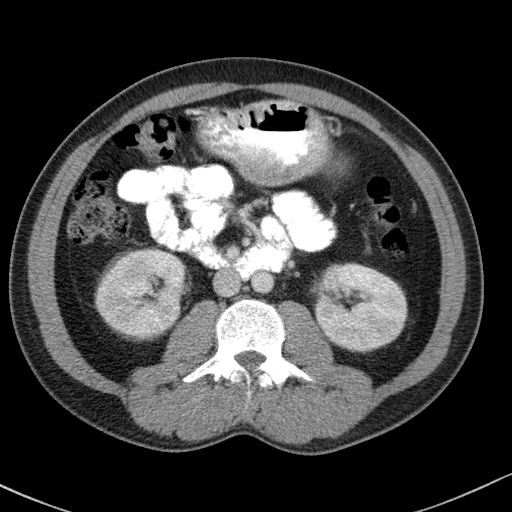
[im 53/81  soft-tissue]
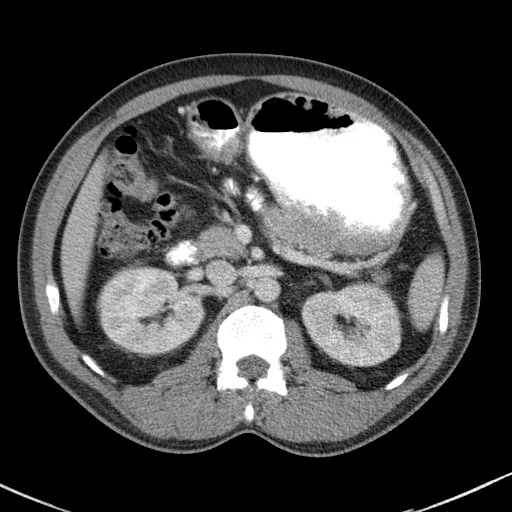
[im 53/81  bone]
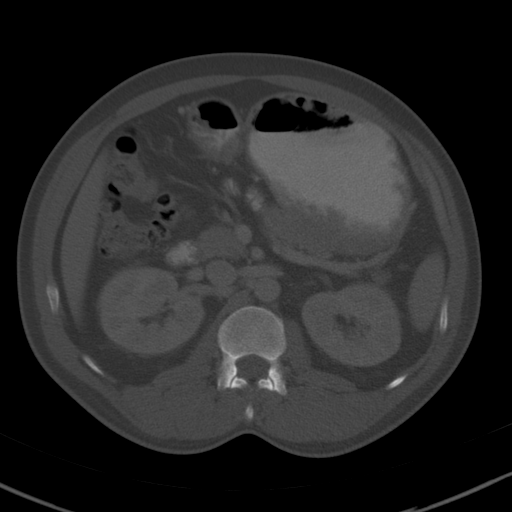
[im 60/81  soft-tissue]
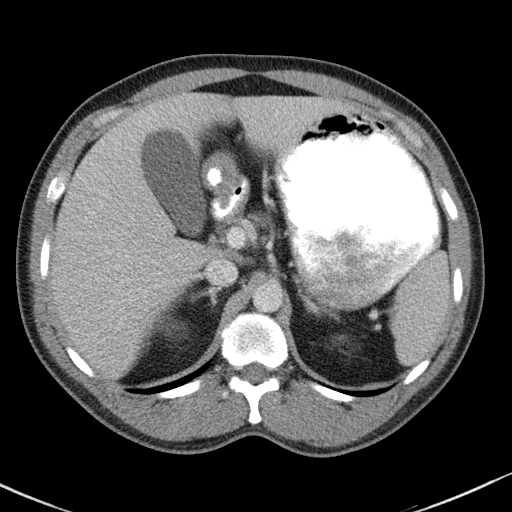
[im 63/81  soft-tissue]
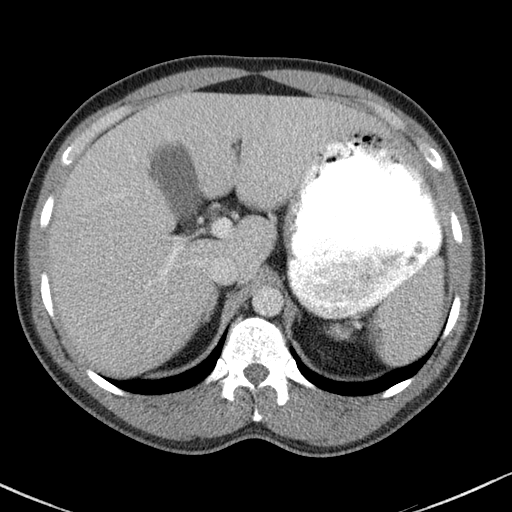
[im 70/81  soft-tissue]
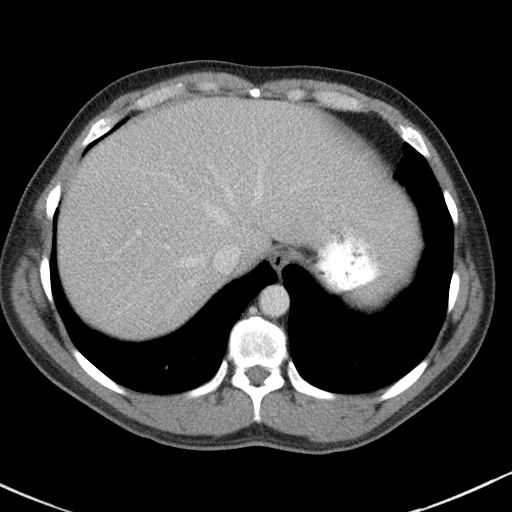
[im 77/81  soft-tissue]
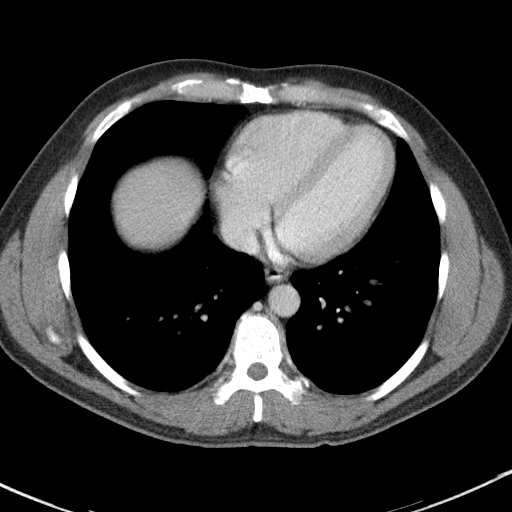

[Series 5: abd/pelvis 3.0 coronal · coronal · 0.62mm/px · 3 of 86 slices shown]
[im 29/86  soft-tissue]
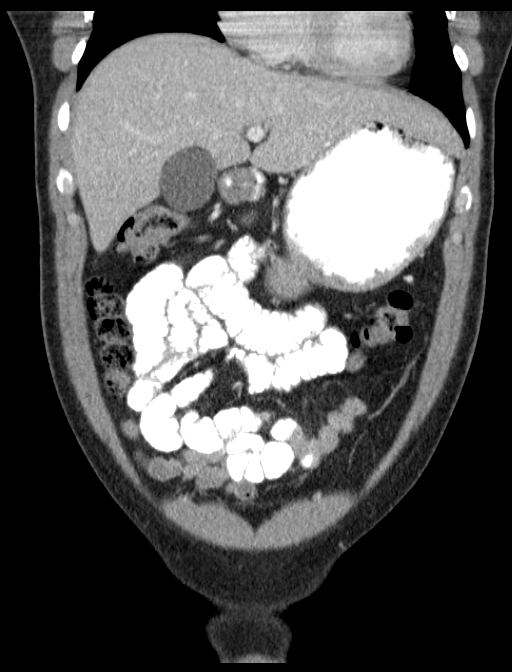
[im 38/86  soft-tissue]
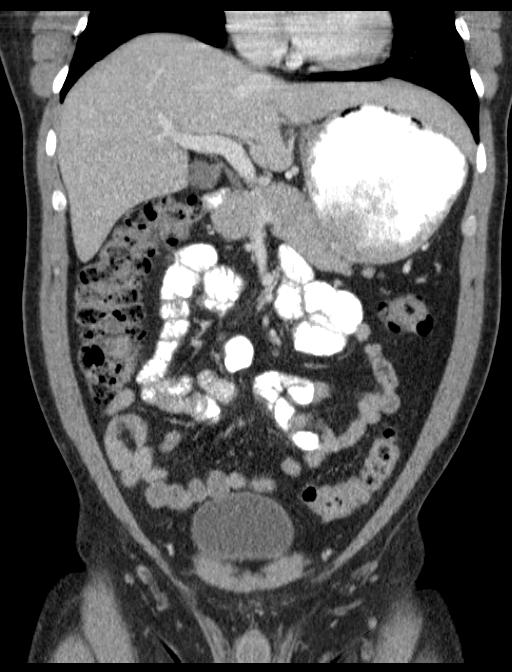
[im 48/86  soft-tissue]
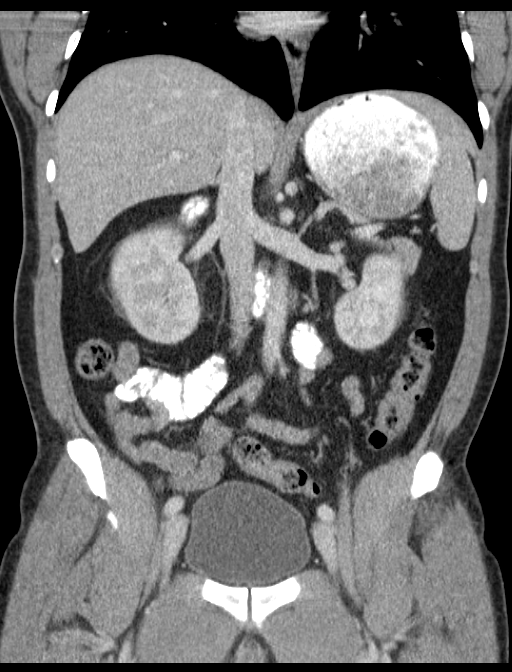

[16 of 46 positions shown; findings below may reference images not displayed]

FINDINGS: The visualized lung bases are clear.

The liver and spleen are unremarkable in appearance.  The
gallbladder is within normal limits.  The pancreas and adrenal
glands are unremarkable.

There is mild heterogeneity of bilateral renal enhancement, with
associated perinephric stranding and trace fluid.  This may reflect
mild bilateral pyelonephritis.  There is no evidence of
hydronephrosis.  No renal or ureteral stones are seen.

No free fluid is identified.  The small bowel is unremarkable in
appearance.  The stomach is within normal limits.  No acute
vascular abnormalities are seen.

The appendix is normal in caliber and contains air, without
evidence for appendicitis.  The colon is grossly unremarkable in
appearance.

The bladder is moderately distended and grossly unremarkable.  The
prostate remains normal in size.  No inguinal lymphadenopathy is
seen.

No acute osseous abnormalities are identified.  There are chronic
bilateral pars defects at L5, with grade 1 anterolisthesis of L5 on
S1.  Associated mild focal lordosis is noted at this level.
IMPRESSION: 1.  Mildly heterogeneous bilateral renal enhancement, with
associated perinephric stranding and trace fluid, concerning for
bilateral pyelonephritis.  Interstitial nephritis could conceivably
have a similar appearance.
2.  Chronic bilateral pars defects at L5, with grade 1
anterolisthesis of L5 on S1.  Associated mild focal lordosis is
seen at this level.

## 2020-03-13 ENCOUNTER — Ambulatory Visit: Payer: Self-pay | Attending: Internal Medicine

## 2020-03-13 DIAGNOSIS — Z23 Encounter for immunization: Secondary | ICD-10-CM

## 2020-03-13 NOTE — Progress Notes (Signed)
   Covid-19 Vaccination Clinic  Name:  Dustin Meyer    MRN: [id number] DOB: 1976/03/14  03/13/2020  [name] was observed post Covid-19 immunization for 15 minutes without incident. He was provided with Vaccine Information Sheet and instruction to access the V-Safe system.   [name] was instructed to call 911 with any severe reactions post vaccine: Marland Kitchen Difficulty breathing  . Swelling of face and throat  . A fast heartbeat  . A bad rash all over body  . Dizziness and weakness   Immunizations Administered    Name Date Dose VIS Date Route   Pfizer COVID-19 Vaccine 03/13/2020 10:29 AM 0.3 mL 11/29/2019 Intramuscular   Manufacturer: [employer], [employer]   Lot: [id number]   NDC: [address]-0

## 2020-04-06 ENCOUNTER — Ambulatory Visit: Payer: Self-pay | Attending: Internal Medicine

## 2020-04-06 DIAGNOSIS — Z23 Encounter for immunization: Secondary | ICD-10-CM

## 2020-04-06 NOTE — Progress Notes (Signed)
   Covid-19 Vaccination Clinic  Name:  Dustin Meyer    MRN: [id number] DOB: [birth year]  04/06/2020  [name] was observed post Covid-19 immunization for 15 minutes without incident. He was provided with Vaccine Information Sheet and instruction to access the V-Safe system.   [name] was instructed to call 911 with any severe reactions post vaccine: Marland Kitchen Difficulty breathing  . Swelling of face and throat  . A fast heartbeat  . A bad rash all over body  . Dizziness and weakness   Immunizations Administered    Name Date Dose VIS Date Route   Pfizer COVID-19 Vaccine 04/06/2020  1:31 PM 0.3 mL 02/12/2019 Intramuscular   Manufacturer: [employer], [employer]   Lot: [id number]   NDC: [address]-4

## 2024-06-07 ENCOUNTER — Other Ambulatory Visit (HOSPITAL_COMMUNITY): Payer: Self-pay
